# Patient Record
Sex: Male | Born: 1990 | Race: Black or African American | Hispanic: No | Marital: Single | Smoking: Current every day smoker
Health system: Southern US, Community
[De-identification: ages and names within clinical notes are randomized; demographics above are authoritative.]

---
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
## 2016-11-01 ENCOUNTER — Encounter (HOSPITAL_BASED_OUTPATIENT_CLINIC_OR_DEPARTMENT_OTHER): Payer: Self-pay | Admitting: *Deleted

## 2016-11-01 ENCOUNTER — Emergency Department (HOSPITAL_BASED_OUTPATIENT_CLINIC_OR_DEPARTMENT_OTHER): Payer: Self-pay

## 2016-11-01 ENCOUNTER — Emergency Department (HOSPITAL_BASED_OUTPATIENT_CLINIC_OR_DEPARTMENT_OTHER)
Admission: EM | Admit: 2016-11-01 | Discharge: 2016-11-01 | Disposition: A | Discharge status: Home / Self Care | Payer: Self-pay | Attending: Emergency Medicine | Admitting: Emergency Medicine

## 2016-11-01 DIAGNOSIS — Y929 Unspecified place or not applicable: Secondary | ICD-10-CM | POA: Insufficient documentation

## 2016-11-01 DIAGNOSIS — Y999 Unspecified external cause status: Secondary | ICD-10-CM | POA: Insufficient documentation

## 2016-11-01 DIAGNOSIS — T07XXXA Unspecified multiple injuries, initial encounter: Secondary | ICD-10-CM

## 2016-11-01 DIAGNOSIS — S5002XA Contusion of left elbow, initial encounter: Secondary | ICD-10-CM | POA: Insufficient documentation

## 2016-11-01 DIAGNOSIS — S1091XA Abrasion of unspecified part of neck, initial encounter: Secondary | ICD-10-CM | POA: Insufficient documentation

## 2016-11-01 DIAGNOSIS — Y939 Activity, unspecified: Secondary | ICD-10-CM | POA: Insufficient documentation

## 2016-11-01 DIAGNOSIS — F17210 Nicotine dependence, cigarettes, uncomplicated: Secondary | ICD-10-CM | POA: Insufficient documentation

## 2016-11-01 DIAGNOSIS — S50812A Abrasion of left forearm, initial encounter: Secondary | ICD-10-CM | POA: Insufficient documentation
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
## 2016-11-01 NOTE — ED Notes (Signed)
ED Provider at bedside. 

## 2016-11-01 NOTE — Discharge Instructions (Signed)
You can take ibuprofen, available over the counter, as needed for pain.
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
## 2021-12-03 ENCOUNTER — Emergency Department (HOSPITAL_BASED_OUTPATIENT_CLINIC_OR_DEPARTMENT_OTHER)
Admission: EM | Admit: 2021-12-03 | Discharge: 2021-12-03 | Disposition: A | Discharge status: Home / Self Care | Payer: 59 | Attending: Emergency Medicine | Admitting: Emergency Medicine

## 2021-12-03 ENCOUNTER — Encounter (HOSPITAL_BASED_OUTPATIENT_CLINIC_OR_DEPARTMENT_OTHER): Payer: Self-pay

## 2021-12-03 ENCOUNTER — Other Ambulatory Visit: Payer: Self-pay

## 2021-12-03 DIAGNOSIS — Y990 Civilian activity done for income or pay: Secondary | ICD-10-CM | POA: Insufficient documentation

## 2021-12-03 DIAGNOSIS — M6289 Other specified disorders of muscle: Secondary | ICD-10-CM | POA: Diagnosis not present

## 2021-12-03 DIAGNOSIS — M25512 Pain in left shoulder: Secondary | ICD-10-CM | POA: Insufficient documentation

## 2021-12-03 DIAGNOSIS — M546 Pain in thoracic spine: Secondary | ICD-10-CM | POA: Insufficient documentation

## 2021-12-03 DIAGNOSIS — M549 Dorsalgia, unspecified: Secondary | ICD-10-CM | POA: Diagnosis present

## 2021-12-03 DIAGNOSIS — X500XXA Overexertion from strenuous movement or load, initial encounter: Secondary | ICD-10-CM | POA: Diagnosis not present

## 2021-12-03 MED ORDER — METHOCARBAMOL 500 MG PO TABS: 500.0000 mg | ORAL_TABLET | Freq: Two times a day (BID) | ORAL | 0 refills | Status: AC

## 2021-12-03 NOTE — ED Provider Notes (Signed)
?[facility] EMERGENCY DEPARTMENT ?Provider Note ? ? ?CSN: [id number] ?Arrival date & time: 12/03/21  1104 ? ?  ? ?History ? ?Chief Complaint  ?Patient presents with  ? Chest Pain  ? Back Pain  ? ? ?Hector Howell is a 31 y.o. male. ? ?Patient with no pertinent past medical history presents today with complaints of left back and shoulder pain.  He states that same began 1 week ago and got worse at work today when doing heavy lifting. He states his girlfriend gave him a muscle relaxer and it improved his pain. He endorses that his pain is isolated to his back but does get worse when he expands his chest to take a deep breath. Denies any fevers, chills, shortness of breath, or chest pain, nausea, or vomiting. ? ?The history is provided by the patient. No language interpreter was used.  ?Chest Pain ?Associated symptoms: back pain   ?Associated symptoms: no fever and no shortness of breath   ?Back Pain ?Associated symptoms: no chest pain and no fever   ? ?  ? ?Home Medications ?Prior to Admission medications   ?Not on File  ?   ? ?Allergies    ?Patient has no known allergies.   ? ?Review of Systems   ?Review of Systems  ?Constitutional:  Negative for chills and fever.  ?Respiratory:  Negative for shortness of breath.   ?Cardiovascular:  Negative for chest pain.  ?Musculoskeletal:  Positive for back pain and myalgias.  ?All other systems reviewed and are negative. ? ?Physical Exam ?Updated Vital Signs ?BP 138/90 (BP Location: Right Arm)   Pulse 90   Temp 99.2 ?F (37.3 ?C) (Oral)   Resp (!) 22   Ht 6' (1.829 m)   Wt 79.4 kg   SpO2 99%   BMI 23.73 kg/m?  ?Physical Exam ?Vitals and nursing note reviewed.  ?Constitutional:   ?   General: He is not in acute distress. ?   Appearance: Normal appearance. He is normal weight. He is not ill-appearing, toxic-appearing or diaphoretic.  ?HENT:  ?   Head: Normocephalic and atraumatic.  ?Cardiovascular:  ?   Rate and Rhythm: Normal rate and regular rhythm.  ?   Heart  sounds: Normal heart sounds.  ?Pulmonary:  ?   Effort: Pulmonary effort is normal. No respiratory distress.  ?Musculoskeletal:     ?   General: Normal range of motion.  ?   Cervical back: Normal range of motion.  ?   Right lower leg: No tenderness. No edema.  ?   Left lower leg: No tenderness. No edema.  ?   Comments: Palpable MSK tightness and tenderness noted to the left scapula area. No tenderness to palpation of cervical, thoracic, or lumbar spine.  ?Skin: ?   General: Skin is warm and dry.  ?Neurological:  ?   General: No focal deficit present.  ?   Mental Status: He is alert.  ?Psychiatric:     ?   Mood and Affect: Mood normal.     ?   Behavior: Behavior normal.  ? ? ?ED Results / Procedures / Treatments   ?Labs ?(all labs ordered are listed, but only abnormal results are displayed) ?Labs Reviewed - No data to display ? ?EKG ?None ? ?Radiology ?No results found. ? ?Procedures ?Procedures  ? ? ?Medications Ordered in ED ?Medications - No data to display ? ?ED Course/ Medical Decision Making/ A&P ?  ?                        ?  Medical Decision Making ? ?Patient presents today with left back and shoulder pain for 1 week. He is afebrile, non-toxic appearing, and in no acute distress with reassuring vital signs.  Exam remarkable for significant palpable musculoskeletal tenderness on the left back and shoulder region.  Suspect that this is the etiology of the patient's symptoms.  EKG unremarkable for acute findings, sinus rhythm.  No concern for cardiac etiology of symptoms.  Patient does endorse some pain with deep breathing, however he is PERC negative and denies any shortness of breath.  Suspect that this is also MSK in nature.  Endorses significant improvement with home muscle relaxer. No further emergent concerns at this time. He is stable for discharge at this time.  Patient is understanding and amenable with plan, educated on red flag symptoms of prompt immediate return.  Discharged stable condition. ? ? ?Final  Clinical Impression(s) / ED Diagnoses ?Final diagnoses:  ?Acute pain of left shoulder  ?Acute left-sided thoracic back pain  ?Muscle tightness  ? ? ?Rx / DC Orders ?ED Discharge Orders   ? ?      Ordered  ?  methocarbamol (ROBAXIN) 500 MG tablet  2 times daily       ? 12/03/21 1253  ? ?  ?  ? ?  ?An After Visit Summary was printed and given to the patient. ? ? ?  ?Silva Bandy, PA-C ?12/03/21 1254 ? ?  ?Sloan Leiter, DO ?12/06/21 1216 ? ?

## 2021-12-03 NOTE — Discharge Instructions (Signed)
As we discussed, your work-up in the ER today was reassuring for acute abnormalities.  I suspect that your symptoms are related to musculoskeletal tightness in your back and shoulder region.  I have given you a muscle relaxer for management of same.  Please do not drive or operate heavy machinery after taking this medication as it can be sedating.  You may also take Tylenol/ibuprofen as needed for pain.  I have also attached some exercises for you to do to help loosen these tight muscles. ? ?Return if development of any new or worsening symptoms. ?

## 2021-12-03 NOTE — ED Triage Notes (Addendum)
Pt presents with a 2 day hx of L back and chest pain. Pt states it was aggravated this morning while lifting something at work. Pt reports upon worsening he felt tightness in his chest and made it hurt to breathe. 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  ?
